# Patient Record
Sex: Female | Born: 1935 | Race: Black or African American | Hispanic: No | Smoking: Never smoker
Health system: Southern US, Community
[De-identification: ages and names within clinical notes are randomized; demographics above are authoritative.]

## PROBLEM LIST (undated history)

## (undated) DIAGNOSIS — F0390 Unspecified dementia without behavioral disturbance: Secondary | ICD-10-CM

## (undated) DIAGNOSIS — I10 Essential (primary) hypertension: Secondary | ICD-10-CM

## (undated) HISTORY — PX: APPENDECTOMY: SHX54

## (undated) HISTORY — DX: Essential (primary) hypertension: I10

## (undated) HISTORY — DX: Unspecified dementia, unspecified severity, without behavioral disturbance, psychotic disturbance, mood disturbance, and anxiety: F03.90

## (undated) HISTORY — PX: ABDOMINAL HYSTERECTOMY: SHX81

## (undated) HISTORY — PX: CHOLECYSTECTOMY: SHX55

---

## 2002-07-27 ENCOUNTER — Encounter: Payer: Self-pay | Admitting: Orthopedic Surgery

## 2002-07-27 ENCOUNTER — Ambulatory Visit (HOSPITAL_COMMUNITY): Admission: RE | Admit: 2002-07-27 | Discharge: 2002-07-27 | Payer: Self-pay | Admitting: Orthopedic Surgery

## 2003-01-12 ENCOUNTER — Emergency Department (HOSPITAL_COMMUNITY): Admission: EM | Admit: 2003-01-12 | Discharge: 2003-01-13 | Payer: Self-pay | Admitting: Emergency Medicine

## 2004-05-29 ENCOUNTER — Ambulatory Visit: Payer: Self-pay | Admitting: Orthopedic Surgery

## 2004-07-03 ENCOUNTER — Ambulatory Visit: Payer: Self-pay | Admitting: Orthopedic Surgery

## 2004-07-14 ENCOUNTER — Ambulatory Visit (HOSPITAL_COMMUNITY): Admission: RE | Admit: 2004-07-14 | Discharge: 2004-07-14 | Payer: Self-pay | Admitting: Orthopedic Surgery

## 2004-07-14 ENCOUNTER — Ambulatory Visit: Payer: Self-pay | Admitting: Orthopedic Surgery

## 2004-07-17 ENCOUNTER — Ambulatory Visit: Payer: Self-pay | Admitting: Orthopedic Surgery

## 2004-08-14 ENCOUNTER — Ambulatory Visit: Payer: Self-pay | Admitting: Orthopedic Surgery

## 2004-12-11 ENCOUNTER — Ambulatory Visit: Payer: Self-pay | Admitting: Orthopedic Surgery

## 2006-07-11 ENCOUNTER — Ambulatory Visit: Payer: Self-pay | Admitting: Orthopedic Surgery

## 2007-09-03 ENCOUNTER — Ambulatory Visit: Payer: Self-pay | Admitting: Orthopedic Surgery

## 2007-09-03 DIAGNOSIS — M1710 Unilateral primary osteoarthritis, unspecified knee: Secondary | ICD-10-CM

## 2007-09-03 DIAGNOSIS — M25569 Pain in unspecified knee: Secondary | ICD-10-CM | POA: Insufficient documentation

## 2008-03-10 ENCOUNTER — Ambulatory Visit: Payer: Self-pay | Admitting: Orthopedic Surgery

## 2008-06-11 ENCOUNTER — Telehealth: Payer: Self-pay | Admitting: Orthopedic Surgery

## 2008-06-15 ENCOUNTER — Encounter: Admission: RE | Admit: 2008-06-15 | Discharge: 2008-06-15 | Payer: Self-pay | Admitting: Internal Medicine

## 2008-06-15 ENCOUNTER — Telehealth: Payer: Self-pay | Admitting: Orthopedic Surgery

## 2009-01-10 ENCOUNTER — Ambulatory Visit: Payer: Self-pay | Admitting: Orthopedic Surgery

## 2009-08-17 ENCOUNTER — Telehealth (INDEPENDENT_AMBULATORY_CARE_PROVIDER_SITE_OTHER): Payer: Self-pay | Admitting: *Deleted

## 2009-08-17 ENCOUNTER — Ambulatory Visit: Payer: Self-pay | Admitting: Orthopedic Surgery

## 2010-02-07 NOTE — Progress Notes (Signed)
Summary: daughter asking for phone in of Rx  Phone Note Call from Patient   Caller: Daughter Summary of Call: Patient's daughter Morgan Tucker "Morgan Tucker" Morgan Tucker called to let Dr Romeo Apple know that Morgan Tucker left her prescription with [facility] [place] and has gone back to [place].  Asking for call in to pharmacy in [place], [place] at ph [phone].   Please call patient  to advise Patient ph is [phone] Initial call taken by: Cammie Sickle,  August 17, 2009 4:31 PM  Follow-up for Phone Call        done Follow-up by: Ether Griffins,  August 17, 2009 4:56 PM

## 2010-02-07 NOTE — Assessment & Plan Note (Signed)
Summary: BILAT KNEE PAIN/LAST XR'S HERE MAR 2010/MEDICARE SEC HORIZ/CAF   Visit Type:  Follow-up  CC:  bilateral knee pain.  History of Present Illness: I saw Morgan Tucker in the office today for a followup visit.  She is a 77 years old woman with the complaint of:  bilateral knee pain.  DX:  Arthritis.  Treatment:  Meds.  MEDS: Nabumetome.  Complaints:  She has been having pain for about 1 month. She is out of her medicine, needs refill.  Today, scheduled for:  Recheck.  Review of systems negative.  RIGHT and LEFT knee exam  This is a small framed female, who looks younger than her stated age. Mood and affect are normal. She has relatively normal gait and station. She is oriented x3. Her pulses are good in both feet. She has no lymphadenopathy. Sensation is normal. She has tenderness over both medial joint lines with no swelling. Her range of motion is 125 her knees are stable. Her strength is normal. Meniscal signs are negative.  Previous x-rays from March of 2010, show bilateral moderate osteoarthritis.  Assessment stable arthritis. Recommend injection for RIGHT knee, which is more symptomatic continue nabumetone. Followup as needed   Allergies: 1)  ! * Tylenol With Codeine   Impression & Recommendations:  Problem # 1:  KNEE, ARTHRITIS, DEGEN./OSTEO (ICD-715.96) Assessment Deteriorated  inject RIGHT knee Verbal consent was obtained. The knee was prepped with alcohol and ethyl chloride. 1 cc of depomedrol 40mg /cc and 4 cc of lidocaine 1% was injected. there were no complications.  Her updated medication list for this problem includes:    Nabumetone 500 Mg Tabs (Nabumetone) .Marland Kitchen... 1 by mouth two times a day  Orders: Est. Patient Level III (16109) Joint Aspirate / Injection, Large (20610) Depo- Medrol 40mg  (J1030)  Patient Instructions: 1)  You have received an injection of cortisone today. You may experience increased pain at the injection site. Apply ice pack to  the area for 20 minutes every 2 hours and take 2 xtra strength tylenol every 8 hours. This increased pain will usually resolve in 24 hours. The injection will take effect in 3-10 days.   2)  take medication  Prescriptions: NABUMETONE 500 MG TABS (NABUMETONE) 1 by mouth two times a day  #60 x 5   Entered and Authorized by:   Fuller Canada MD   Signed by:   Fuller Canada MD on 01/10/2009   Method used:   Print then Give to Patient   RxID:   [id number]

## 2010-02-07 NOTE — Assessment & Plan Note (Signed)
Summary: BI KNEE PAIN REQ INJECTION/SEC HOR/BSF   Visit Type:  Follow-up  CC:  bilateral knee pain.  History of Present Illness: I saw Morgan Tucker in the office today for a followup visit.  She is a 77 years old woman with the complaint of:  bilateral knee pain.  DX:  Arthritis.  Treatment:  Meds.  MEDS: Nabumetome, helped, is out of med need refills.  Today she requests injections bilateral knees.  Last injections 01/10/09.  Last xrays 03/2008 for review.    Allergies: 1)  ! * Tylenol With Codeine  Physical Exam  Additional Exam:  She is ambulating with a cane.  She has bilateral medial joint line tenderness.  Mild flexion contracture.  Flexion of 120.  Strength normal muscle tone normal.  strength is normal.  Sensation normal., She is awake alert and oriented x3.  She looked very healthy.   Impression & Recommendations:  Problem # 1:  KNEE, ARTHRITIS, DEGEN./OSTEO (ICD-715.96) Assessment Deteriorated  Bilateral knee injections RIGHT first then LEFT with the following technique Verbal consent was obtained. The knee was prepped with alcohol and ethyl chloride. 1 cc of depomedrol 40mg /cc and 4 cc of lidocaine 1% was injected. there were no complications.  Orders: Est. Patient Level III (54098) Joint Aspirate / Injection, Large (20610) Depo- Medrol 40mg  (J1030)  Medications Added to Medication List This Visit: 1)  Ultracet 37.5-325 Mg Tabs (Tramadol-acetaminophen) .Marland Kitchen.. 1 q 4 -6 as needed pain   take with food  Patient Instructions: 1)  Please schedule a follow-up appointment in 3 months. Prescriptions: ULTRACET 37.5-325 MG TABS (TRAMADOL-ACETAMINOPHEN) 1 q 4 -6 as needed pain   take with food  #60 x 5   Entered and Authorized by:   Fuller Canada MD   Signed by:   Fuller Canada MD on 08/17/2009   Method used:   Print then Give to Patient   RxID:   [id number]

## 2010-03-10 ENCOUNTER — Emergency Department (HOSPITAL_COMMUNITY): Admission: EM | Admit: 2010-03-10 | Discharge: 2010-03-10 | Discharge status: Against Medical Advice | Payer: Self-pay

## 2010-03-10 ENCOUNTER — Emergency Department (HOSPITAL_COMMUNITY)
Admission: EM | Admit: 2010-03-10 | Discharge: 2010-03-10 | Disposition: A | Discharge status: Home / Self Care | Payer: Medicare Other | Attending: Emergency Medicine | Admitting: Emergency Medicine

## 2010-03-10 ENCOUNTER — Emergency Department (HOSPITAL_COMMUNITY)
Admission: EM | Admit: 2010-03-10 | Discharge: 2010-03-10 | Discharge status: Against Medical Advice | Payer: Self-pay | Attending: Emergency Medicine | Admitting: Emergency Medicine

## 2010-03-10 ENCOUNTER — Emergency Department (HOSPITAL_COMMUNITY): Payer: Self-pay

## 2010-03-10 DIAGNOSIS — G309 Alzheimer's disease, unspecified: Secondary | ICD-10-CM | POA: Insufficient documentation

## 2010-03-10 DIAGNOSIS — R112 Nausea with vomiting, unspecified: Secondary | ICD-10-CM | POA: Insufficient documentation

## 2010-03-10 DIAGNOSIS — I129 Hypertensive chronic kidney disease with stage 1 through stage 4 chronic kidney disease, or unspecified chronic kidney disease: Secondary | ICD-10-CM | POA: Insufficient documentation

## 2010-03-10 DIAGNOSIS — F329 Major depressive disorder, single episode, unspecified: Secondary | ICD-10-CM | POA: Insufficient documentation

## 2010-03-10 DIAGNOSIS — N189 Chronic kidney disease, unspecified: Secondary | ICD-10-CM | POA: Insufficient documentation

## 2010-03-10 DIAGNOSIS — F0280 Dementia in other diseases classified elsewhere without behavioral disturbance: Secondary | ICD-10-CM | POA: Insufficient documentation

## 2010-03-10 DIAGNOSIS — E785 Hyperlipidemia, unspecified: Secondary | ICD-10-CM | POA: Insufficient documentation

## 2010-03-10 DIAGNOSIS — F3289 Other specified depressive episodes: Secondary | ICD-10-CM | POA: Insufficient documentation

## 2010-03-10 DIAGNOSIS — M129 Arthropathy, unspecified: Secondary | ICD-10-CM | POA: Insufficient documentation

## 2010-03-10 LAB — COMPREHENSIVE METABOLIC PANEL
ALT: 10 U/L (ref 0–35)
Alkaline Phosphatase: 48 U/L (ref 39–117)
BUN: 15 mg/dL (ref 6–23)
CO2: 29 mEq/L (ref 19–32)
Chloride: 105 mEq/L (ref 96–112)
Glucose, Bld: 89 mg/dL (ref 70–99)
Potassium: 3.6 mEq/L (ref 3.5–5.1)
Sodium: 142 mEq/L (ref 135–145)
Total Bilirubin: 0.9 mg/dL (ref 0.3–1.2)

## 2010-03-10 LAB — CBC
HCT: 33.9 % — ABNORMAL LOW (ref 36.0–46.0)
Hemoglobin: 11.2 g/dL — ABNORMAL LOW (ref 12.0–15.0)
MCV: 85.6 fL (ref 78.0–100.0)
RBC: 3.96 MIL/uL (ref 3.87–5.11)
RDW: 12.6 % (ref 11.5–15.5)
WBC: 6.5 10*3/uL (ref 4.0–10.5)

## 2010-03-10 LAB — URINALYSIS, ROUTINE W REFLEX MICROSCOPIC
Nitrite: NEGATIVE
Specific Gravity, Urine: 1.015 (ref 1.005–1.030)
pH: 6.5 (ref 5.0–8.0)

## 2010-03-10 LAB — DIFFERENTIAL
Eosinophils Relative: 2 % (ref 0–5)
Lymphocytes Relative: 20 % (ref 12–46)
Lymphs Abs: 1.3 10*3/uL (ref 0.7–4.0)
Neutro Abs: 4.6 10*3/uL (ref 1.7–7.7)

## 2010-05-26 NOTE — H&P (Signed)
NAME:  Morgan Tucker, Morgan Tucker NO.:  [id number]   MEDICAL RECORD NO.:  [id number]          PATIENT TYPE:  AMB   LOCATION:  DAY                           FACILITY:  [facility]   PHYSICIAN:  Vickki Hearing, M.D.DATE OF BIRTH:  06/09/1920   DATE OF ADMISSION:  DATE OF DISCHARGE:  LH                                HISTORY & PHYSICAL   CHIEF COMPLAINT:  Knee pain.   HISTORY:  This is an 77 year old female, status post progressive onset of  pain in her right knee associated with swelling and dull ache, moderate in  severity, duration of approximately 3 weeks on initial presentation,  worsened by ambulation.  Apparently the patient felt a pop in her knee and  then she had associated swelling.  She does also give a history of weight  loss, reflux. Other 8 systems reviewed were normal.   ALLERGIES:  She reports allergies to TYLENOL and ASPIRIN.   PAST HISTORY:  Reflux noted as a medical problem.  She has had an  appendectomy, cholecystectomy, knee surgery, tumor excised.   CURRENT MEDICATIONS:  She takes meclizine 12.5, potassium 10, chlorthalidone  25.   FAMILY HISTORY:  Negative.   SOCIAL HISTORY:  Family physician is Dr. Darius Bump.  She is widowed.  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  She does not smoke or drink.  Caffeine use -- 1/4 cup of coffee per  day.  She has a 7th grade education.   PHYSICAL EXAMINATION:  VITAL SIGNS:  Her exam shows a weight of 123, pulse  78, respiratory rate is 16.  GENERAL APPEARANCE:  Normal development, grooming, hygiene and nutrition.  Body habitus ectomorphic.  PSYCHOLOGICAL EXAM:  She is alert and oriented x3.  NEUROLOGIC:  Sensation is normal.  CARDIOVASCULAR:  Pulse is normal.  Venous stasis none.  No edema.  SKIN:  Normal.  EXTREMITIES:  Examination of the right knee reveals normal range of motion,  strength and stability, tenderness and swelling over the pes bursa and  medial compartment.  Her opposite extremity shows no such abnormality; and  her upper  extremities are normal.   RADIOGRAPHS:  Show osteoarthritis, mild, of the knee.   DIAGNOSES:  1.  Osteoarthritis.  2.  Pes bursitis.  3.  Pain with medial meniscal tear.  She has had 1 injection which did help      for approximately a month and presented back with pain and increased      mechanical symptoms.   PLAN:  Arthroscopy right knee with medial meniscectomy.       SEH/MEDQ  D:  07/13/2004  T:  07/13/2004  Job:  [id number]

## 2010-05-26 NOTE — Op Note (Signed)
NAME:  Morgan Tucker, Morgan NO.:  [id number]   MEDICAL RECORD NO.:  [id number]          PATIENT TYPE:  AMB   LOCATION:  DAY                           FACILITY:  [facility]   PHYSICIAN:  Vickki Hearing, M.D.DATE OF BIRTH:  06/09/1920   DATE OF PROCEDURE:  07/14/2004  DATE OF DISCHARGE:                                 OPERATIVE REPORT   PREOPERATIVE DIAGNOSIS:  Medial meniscal tear, right knee.   POSTOPERATIVE DIAGNOSIS:  Medial meniscal tear, right knee.   PROCEDURE:  Arthroscopy, right knee, partial medial meniscectomy.   SURGEON:  Dr. Romeo Apple, no assistants.   ANESTHETIC:  Spinal.   No specimen. No blood loss. No complications. The patient went to PACU in  good condition.   INDICATIONS:  Pain, right knee.   HISTORY:  This is a 77 year old female had pain in a right knee, medial  compartment, treated with cortisone injection and did not help had  mechanical symptoms and pain and presented for surgery.   The patient was identified as Morgan Tucker. Alers in the preop holding area. Her  right knee was marked for surgery, countersigned by the surgeon. History and  physical was updated and antibiotics were given. She was taken to the  operating room. She had a spinal anesthetic. She was placed on the operating  table. Her right knee was prepped and draped in sterile technique at which  time a time-out was taken and completed as required.   The knee was approached from a lateral portal. Diagnostic arthroscopy was  performed. We found a torn medial meniscus, mild grade 1 to 2 changes medial  femoral condyle and tibial plateau. Lateral compartment notch and  patellofemoral joint relatively normal.   Using a medial portal and a probe, we defined the torn meniscus. We resected  the meniscal tear with a straight duckbill forceps, balanced the meniscus  using a straight shaver and a 50-degree ArthroCare wand. The knee was  irrigated. We suctioned all the debris from the  knee, washed the knee, and  then closed the knee with three Steri-  Strips on each portal. We injected 30 cc of Marcaine. We applied a sterile  dressing and a CryoCuff. We took the patient to recovery room in stable  condition. She can be weightbearing as tolerated. Follow-up on Monday.  CryoCuff for 48 hours and then change dressings in 24 hours       SEH/MEDQ  D:  07/14/2004  T:  07/14/2004  Job:  [id number]

## 2010-05-26 NOTE — Op Note (Signed)
NAME:  Morgan Tucker, EPPING NO.:  [id number]   MEDICAL RECORD NO.:  [id number]                   PATIENT TYPE:  AMB   LOCATION:  DAY                                  [facility]  [facility]   PHYSICIAN:  Marlowe Kays, M.D.               DATE OF BIRTH:  06/09/1920   DATE OF PROCEDURE:  07/27/2002  DATE OF DISCHARGE:                                 OPERATIVE REPORT   PREOPERATIVE DIAGNOSIS:  Torn medial meniscus, left knee.   POSTOPERATIVE DIAGNOSIS:  Torn medial meniscus, left knee.   OPERATION:  Left knee arthroscopy with partial medial meniscectomy and  shaving of the medial femoral condyle.   SURGEON:  Marlowe Kays, M.D.   ASSISTANT:  Nurse.   ANESTHESIA:  General.   PATHOLOGY AND JUSTIFICATION FOR PROCEDURE:  She sustained a twisting injury  to her left knee in May of this year due to continued pain in the inner  aspect of the knee with swelling in the knee. I sent her for an MRI which  demonstrated a tear of the body of the medial meniscus, this was performed  on July 03, 2002. Accordingly she is here today for the above mentioned  surgery.   DESCRIPTION OF PROCEDURE:  Satisfactory general anesthesia, pneumatic  tourniquet, thigh stabilizer, left knee was prepped with duraprep, draped in  a sterile field. Superomedial saline inflow. First through an anterior  portal the medial compartment of the knee joint was evaluated and showed  modest synovectomy. She had modest synovitis which I resected. There was  some grade 1-2 chondromalacia of the medial femoral condyle which I gently  smoothed down. She had an extensive tear at the posterior curve going all  the way back to the basic rim of the meniscus which I resected back with  baskets and shaved down until smooth with a 3.5 shaver. Looking up in the  medial gutter and suprapatellar area, she had minimal wear on her patella.  Reversing portals laterally, there was a modest synovitis which I  resected.  She had minimal wear of the lateral joint surfaces and minimal fraying of  the lateral meniscus which did not require treatment. The knee joint was  then irrigated until clear and all fluid possible removed, the two anterior  portals were closed with 4-0 nylon, 20 mL of 0.5% Marcaine with adrenaline,  4 mg of morphine were then instilled through the inflap rasp which was  removed and this portal closed with 4-0 nylon as well. Betadine adaptic dry  sterile dressing were applied, tourniquet was released, she tolerated the  procedure well and was taken to the recovery room in satisfactory condition.  Marlowe Kays, M.D.    JA/MEDQ  D:  07/27/2002  T:  07/27/2002  Job:  [id number]

## 2011-03-20 ENCOUNTER — Ambulatory Visit: Payer: Self-pay | Admitting: Orthopedic Surgery

## 2011-09-12 ENCOUNTER — Ambulatory Visit (INDEPENDENT_AMBULATORY_CARE_PROVIDER_SITE_OTHER): Payer: Medicare Other | Admitting: Orthopedic Surgery

## 2011-09-12 ENCOUNTER — Other Ambulatory Visit: Payer: Self-pay | Admitting: *Deleted

## 2011-09-12 ENCOUNTER — Telehealth: Payer: Self-pay | Admitting: *Deleted

## 2011-09-12 ENCOUNTER — Encounter: Payer: Self-pay | Admitting: Orthopedic Surgery

## 2011-09-12 VITALS — BP 100/60 | Ht 60.0 in | Wt 154.0 lb

## 2011-09-12 DIAGNOSIS — M25569 Pain in unspecified knee: Secondary | ICD-10-CM

## 2011-09-12 DIAGNOSIS — R269 Unspecified abnormalities of gait and mobility: Secondary | ICD-10-CM

## 2011-09-12 NOTE — Telephone Encounter (Signed)
Opened in error

## 2011-09-12 NOTE — Patient Instructions (Addendum)
PT [facility] (NO PHONE CALL NEEDED)  You have received a steroid shot. 15% of patients experience increased pain at the injection site with in the next 24 hours. This is best treated with ice and tylenol extra strength 2 tabs every 8 hours. If you are still having pain please call the office.

## 2011-09-12 NOTE — Progress Notes (Signed)
Patient ID: FELICIDAD SUGARMAN, female   DOB: Nov 08, 1920, 78 y.o.   MRN: [id number]   Knee  Injection Procedure Note  Pre-operative Diagnosis: left knee oa  Post-operative Diagnosis: same  Indications: pain  Anesthesia: ethyl chloride   Procedure Details   Verbal consent was obtained for the procedure. Time out was completed.The joint was prepped with alcohol, followed by  Ethyl chloride spray and A 20 gauge needle was inserted into the knee via lateral approach; 4ml 1% lidocaine and 1 ml of depomedrol  was then injected into the joint . The needle was removed and the area cleansed and dressed.  Complications:  None; patient tolerated the procedure well.  Knee  Injection Procedure Note  Pre-operative Diagnosis: right knee oa  Post-operative Diagnosis: same  Indications: pain  Anesthesia: ethyl chloride   Procedure Details   Verbal consent was obtained for the procedure. Time out was completed.The joint was prepped with alcohol, followed by  Ethyl chloride spray and A 20 gauge needle was inserted into the knee via lateral approach; 4ml 1% lidocaine and 1 ml of depomedrol  was then injected into the joint . The needle was removed and the area cleansed and dressed.  Complications:  None; patient tolerated the procedure well.

## 2012-01-23 ENCOUNTER — Ambulatory Visit (INDEPENDENT_AMBULATORY_CARE_PROVIDER_SITE_OTHER): Payer: PRIVATE HEALTH INSURANCE

## 2012-01-23 ENCOUNTER — Encounter: Payer: Self-pay | Admitting: Orthopedic Surgery

## 2012-01-23 ENCOUNTER — Ambulatory Visit: Payer: PRIVATE HEALTH INSURANCE

## 2012-01-23 ENCOUNTER — Ambulatory Visit (INDEPENDENT_AMBULATORY_CARE_PROVIDER_SITE_OTHER): Payer: PRIVATE HEALTH INSURANCE | Admitting: Orthopedic Surgery

## 2012-01-23 VITALS — Ht 60.0 in

## 2012-01-23 DIAGNOSIS — M25569 Pain in unspecified knee: Secondary | ICD-10-CM

## 2012-01-23 DIAGNOSIS — M48061 Spinal stenosis, lumbar region without neurogenic claudication: Secondary | ICD-10-CM | POA: Insufficient documentation

## 2012-01-23 MED ORDER — GABAPENTIN 100 MG PO CAPS: 100.0000 mg | ORAL_CAPSULE | Freq: Three times a day (TID) | ORAL | Status: DC

## 2012-01-23 NOTE — Progress Notes (Signed)
Patient ID: Morgan Tucker, female   DOB: 1920/02/15, 79 y.o.   MRN: [id number] Chief Complaint  Patient presents with  . Knee Pain     This is an 79 year old female currently living at a nursing home, who was found on the floor after her legs gave way. She has a history of osteoarthritis of both knees, but felt throbbing, burning, 9/10. Constant pain, unrelieved by Lidoderm patch, and oral medication.  She is currently using a walker to ambulate. She says she doesn't have a lot of pain but her family, says she's constantly complaining.  She has a review of systems consistent with unsteady gait, nervousness depression. Ordering of the eyes and muscle, joint pain.  Past Medical History  Diagnosis Date  . Dementia   . HTN (hypertension)     Physical Exam(12) GENERAL: normal development   CDV: pulses are normal   Skin: normal  Lymph: nodes were not palpable/normal  Psychiatric: awake, alert and oriented  Neuro: normal sensation  Right Knee Exam   Tenderness  The patient is experiencing no tenderness.     Range of Motion  Extension: 10  Flexion: 110   Muscle Strength   The patient has normal right knee strength.  Tests  Drawer:       Anterior - negative    Posterior - negative  Other  Erythema: absent Scars: absent Sensation: normal Pulse: present Swelling: none   Left Knee Exam   Tenderness  The patient is experiencing no tenderness.     Range of Motion  Extension: 10  Flexion: 110   Muscle Strength   The patient has normal left knee strength.  Tests  Drawer:       Anterior - negative     Posterior - negative  Other  Erythema: absent Scars: absent Sensation: normal Pulse: present Swelling: none    The patient had new x-rays of her knees today. Her knees actually show mild to moderate arthrosis.  She most likely has spinal stenosis with weakness of the lower extremities intermittently.  Recommend 100 mg 3 times a day, continue to use  of a walker.  She is not a surgical candidate.

## 2012-01-23 NOTE — Patient Instructions (Addendum)
Start gabapentin 100 mg tid   Spinal Stenosis One cause of back pain is spinal stenosis. Stenosis means abnormal narrowing. The spinal canal contains and protects the spinal nerve roots. In spinal stenosis, the spinal canal narrows and pinches the spinal cord and nerves. This causes low back pain and pain in the legs. Stenosis may pinch the nerves that control muscles and sensation in the legs. This leads to pain and abnormal feelings in the leg muscles and areas supplied by those nerves. CAUSES   Spinal stenosis often happens to people as they get older and arthritic boney growths occur in their spinal canal. There is also a loss of the disk height between the bones of the back, which also adds to this problem. Sometimes the problem is present at birth. SYMPTOMS    Pain that is generally worse with activities, particularly standing and walking.   Numbness, tingling, hot or cold feelings, weakness, or a weariness in the legs.   Clumsiness, frequent falling, and a foot-slapping gait, which may come as a result of nerve pressure and muscle weakness.  DIAGNOSIS    Your caregiver may suspect spinal stenosis if you have unusual leg symptoms, such as those previously mentioned.   Your orthopedic surgeon may request special imaging exams, such a computerized magnetic scan (MRI) or computerized X-ray scan (CT) to find out the cause of the problem.  TREATMENT    Sometimes treatments such as postural changes or nonsteroidal anti-inflammatory drugs will relieve the pain.   Nonsteroidal anti-inflammatory medications may help relieve symptoms. These medicines do this by decreasing swelling and inflammation in the nerves.   When stenosis causes severe nerve root compression, conservative treatment may not be enough to maintain a normal lifestyle. Surgery may be recommended to relieve the pressure on affected nerves. In properly selected patients, the results are very good, and patients are able to continue  a normal lifestyle.  HOME CARE INSTRUCTIONS    Flexing the spine by leaning forward while walking may relieve symptoms. Lying with the knees drawn up to the chest may offer some relief. These positions enlarge the space available to the nerves. They may make it easier for stenosis sufferers to walk longer distances.   Rest, followed by gradually resuming activity, also can help.   Aerobic activity, such as bicycling or swimming, is often recommended.   Losing weight can also relieve some of the load on the spine.   Application of warm or cold compresses to the area of pain can be helpful.  SEEK MEDICAL CARE IF:    The periods of relief between episodes of pain become shorter and shorter.   You experience pain that radiates down your leg, even when you are not standing or walking.  SEEK IMMEDIATE MEDICAL CARE IF:    You have a loss of bowel or bladder control.   You have a sudden loss of feeling in your legs.   You suddenly cannot move your legs.  Document Released: 03/17/2003 Document Revised: 03/19/2011 Document Reviewed: 05/12/2009 [facility] Patient Information 2013 [place], [place].

## 2012-03-03 ENCOUNTER — Other Ambulatory Visit: Payer: Self-pay | Admitting: *Deleted

## 2012-03-03 DIAGNOSIS — M48061 Spinal stenosis, lumbar region without neurogenic claudication: Secondary | ICD-10-CM

## 2012-03-03 MED ORDER — GABAPENTIN 100 MG PO CAPS: 100.0000 mg | ORAL_CAPSULE | Freq: Three times a day (TID) | ORAL | Status: DC

## 2012-03-11 ENCOUNTER — Other Ambulatory Visit: Payer: Self-pay | Admitting: *Deleted

## 2012-03-11 DIAGNOSIS — M48061 Spinal stenosis, lumbar region without neurogenic claudication: Secondary | ICD-10-CM

## 2012-03-11 MED ORDER — GABAPENTIN 100 MG PO CAPS: 100.0000 mg | ORAL_CAPSULE | Freq: Three times a day (TID) | ORAL | Status: DC

## 2012-06-23 ENCOUNTER — Telehealth: Payer: Self-pay | Admitting: Orthopedic Surgery

## 2012-06-23 NOTE — Telephone Encounter (Signed)
Fleet Contras called this afternoon and said that her mother's legs gave out last Thursday and again today,and she ended up on the floor.  She is not complaining about any new pains, but Lurena Joiner wantes to know if she needs to bring her in for an injection.  She also asked if you think she needs a wheelchair. Please advise. Rebecca's # [phone] (can leave a message)

## 2012-06-23 NOTE — Telephone Encounter (Signed)
No answer, could not leave a message, will try tomorrow

## 2012-06-23 NOTE — Telephone Encounter (Signed)
Wheel chair is fine  Its not arthritis Its spinal stenosis and the only way to fix it is with a spinal decompression which she is not a candidate for   Injection will not help

## 2012-06-30 NOTE — Telephone Encounter (Signed)
Spoke to [place] , relayed doctor's message

## 2013-01-04 ENCOUNTER — Emergency Department (HOSPITAL_COMMUNITY): Payer: PRIVATE HEALTH INSURANCE

## 2013-01-04 ENCOUNTER — Emergency Department (HOSPITAL_COMMUNITY)
Admission: EM | Admit: 2013-01-04 | Discharge: 2013-01-04 | Disposition: A | Discharge status: Home / Self Care | Payer: PRIVATE HEALTH INSURANCE | Attending: Emergency Medicine | Admitting: Emergency Medicine

## 2013-01-04 ENCOUNTER — Encounter (HOSPITAL_COMMUNITY): Payer: Self-pay | Admitting: Emergency Medicine

## 2013-01-04 DIAGNOSIS — S79919A Unspecified injury of unspecified hip, initial encounter: Secondary | ICD-10-CM | POA: Insufficient documentation

## 2013-01-04 DIAGNOSIS — Y9210 Unspecified residential institution as the place of occurrence of the external cause: Secondary | ICD-10-CM | POA: Insufficient documentation

## 2013-01-04 DIAGNOSIS — Z79899 Other long term (current) drug therapy: Secondary | ICD-10-CM | POA: Insufficient documentation

## 2013-01-04 DIAGNOSIS — W19XXXA Unspecified fall, initial encounter: Secondary | ICD-10-CM

## 2013-01-04 DIAGNOSIS — S3981XA Other specified injuries of abdomen, initial encounter: Secondary | ICD-10-CM | POA: Insufficient documentation

## 2013-01-04 DIAGNOSIS — F0390 Unspecified dementia without behavioral disturbance: Secondary | ICD-10-CM | POA: Insufficient documentation

## 2013-01-04 DIAGNOSIS — Y939 Activity, unspecified: Secondary | ICD-10-CM | POA: Insufficient documentation

## 2013-01-04 DIAGNOSIS — I10 Essential (primary) hypertension: Secondary | ICD-10-CM | POA: Insufficient documentation

## 2013-01-04 DIAGNOSIS — R296 Repeated falls: Secondary | ICD-10-CM | POA: Insufficient documentation

## 2013-01-04 MED ORDER — FENTANYL CITRATE 0.05 MG/ML IJ SOLN
50.0000 ug | Freq: Once | INTRAMUSCULAR | Status: AC
  Administered 2013-01-04: 50 ug via INTRAMUSCULAR

## 2013-01-04 MED ORDER — FENTANYL CITRATE 0.05 MG/ML IJ SOLN
50.0000 ug | INTRAMUSCULAR | Status: DC | PRN
  Filled 2013-01-04: qty 2

## 2013-01-04 NOTE — ED Notes (Signed)
Per grandaughter at bedside, pt doesn't walk, she is in a wheelchair due to weakness in her knees. She is able to slide herself over from chair to bed or pull up on rail and pivot to the commode if near by.   Assisted patient to stand at bedside. She was able to bear weight on both BLE, with assist x 2 and tolerated. Did not scream or yell d/t pain.

## 2013-01-04 NOTE — ED Notes (Signed)
Bed: WA02 Expected date: 01/04/13 Expected time: 7:37 PM Means of arrival: Ambulance Comments: fall

## 2013-01-04 NOTE — ED Notes (Addendum)
Pleasant, alert and oriented x 1, person.  C/o groin pain but unsure how it happened. She is able to move feet and  Can attempt to bend knees.

## 2013-01-04 NOTE — Discharge Instructions (Signed)

## 2013-01-04 NOTE — ED Notes (Signed)
Dispatch for [facility] called for transportation toSNF

## 2013-01-04 NOTE — ED Provider Notes (Addendum)
CSN: [id number]     Arrival date & time 01/04/13  1945 History   First MD Initiated Contact with Patient 01/04/13 2006     Chief Complaint  Patient presents with  . Fall   (Consider location/radiation/quality/duration/timing/severity/associated sxs/prior Treatment) HPI Comments: Level 5 caveat due to dementia.  Pt lives at a SNF, was found on the floor, she believes she lives alone near her family.  She had left side groin pain per EMS upon their assessment, but no obvious deformity.  She denies HA, LOC, CP, SOB, back pain, neck pain.  No N/V.    Patient is a 79 y.o. female presenting with fall. The history is provided by the patient and the EMS personnel. The history is limited by the condition of the patient.  Fall This is a new problem. Pertinent negatives include no chest pain, no abdominal pain, no headaches and no shortness of breath.    Past Medical History  Diagnosis Date  . Dementia   . HTN (hypertension)    Past Surgical History  Procedure Laterality Date  . Cholecystectomy    . Appendectomy    . Abdominal hysterectomy     History reviewed. No pertinent family history. History  Substance Use Topics  . Smoking status: Never Smoker   . Smokeless tobacco: Not on file  . Alcohol Use: No   OB History   Grav Para Term Preterm Abortions TAB SAB Ect Mult Living                 Review of Systems  Unable to perform ROS: Dementia  Respiratory: Negative for shortness of breath.   Cardiovascular: Negative for chest pain.  Gastrointestinal: Negative for abdominal pain.  Neurological: Negative for headaches.    Allergies  Acetaminophen-codeine and Asa  Home Medications   Current Outpatient Rx  Name  Route  Sig  Dispense  Refill  . DULoxetine (CYMBALTA) 20 MG capsule   Oral   Take 20 mg by mouth daily.         Marland Kitchen gabapentin (NEURONTIN) 100 MG capsule   Oral   Take 1 capsule (100 mg total) by mouth 3 (three) times daily.   90 capsule   2   . lidocaine  (LIDODERM) 5 %   Transdermal   Place 1 patch onto the skin daily. Remove & Discard patch within 12 hours or as directed by MD         . memantine (NAMENDA) 5 MG tablet   Oral   Take 5 mg by mouth 2 (two) times daily.         . mirtazapine (REMERON) 15 MG tablet   Oral   Take 45 mg by mouth at bedtime.         . rivastigmine (EXELON) 4.6 mg/24hr   Transdermal   Place 4.6 mg onto the skin daily.         . benzonatate (TESSALON) 100 MG capsule   Oral   Take 100 mg by mouth 3 (three) times daily as needed for cough.          Marland Kitchen LORazepam (ATIVAN) 0.5 MG tablet   Oral   Take 0.5 mg by mouth every 4 (four) hours as needed for anxiety. One tablet by mouth 1 hour prior to trip         . ondansetron (ZOFRAN-ODT) 8 MG disintegrating tablet   Oral   Take 8 mg by mouth every 4 (four) hours as needed for nausea.  BP 174/71  Pulse 67  Temp(Src) 98.9 F (37.2 C) (Oral)  Resp 20  SpO2 98% Physical Exam  Nursing note and vitals reviewed. Constitutional: She appears well-developed and well-nourished.  HENT:  Head: Normocephalic and atraumatic.  Eyes: EOM are normal.  Neck: Normal range of motion and full passive range of motion without pain. Neck supple. No spinous process tenderness and no muscular tenderness present.  Cardiovascular: Normal rate, regular rhythm and intact distal pulses.   Pulmonary/Chest: Effort normal. No respiratory distress. She has no wheezes. She has no rales.  Abdominal: Soft. She exhibits no distension. There is no tenderness. There is no rebound.  Musculoskeletal: She exhibits tenderness.       Right shoulder: She exhibits normal range of motion and no tenderness.       Left shoulder: She exhibits normal range of motion and no tenderness.       Left hip: She exhibits decreased range of motion and tenderness. She exhibits no swelling, no deformity and no laceration.       Right knee: No tenderness found.       Left knee: No tenderness  found.       Cervical back: She exhibits normal range of motion, no tenderness and no bony tenderness.       Lumbar back: She exhibits no tenderness and no bony tenderness.  Neurological: She is alert. She exhibits normal muscle tone.  Skin: Skin is warm and dry.  Psychiatric: She has a normal mood and affect.    ED Course  Procedures (including critical care time) Labs Review Labs Reviewed - No data to display Imaging Review Dg Hip Complete Left  01/04/2013   CLINICAL DATA:  Fall.  Left hip pain.  EXAM: LEFT HIP - COMPLETE 2+ VIEW  COMPARISON:  None.  FINDINGS: No evidence of acute fracture or dislocation. Mild to moderate gas that mild joint space narrowing. Generalized osseous demineralization.  Included AP pelvis demonstrates symmetric mild joint space narrowing involving the contralateral right hip. Sacroiliac joints and symphysis pubis intact. Mild degenerative changes involving the lower lumbar spine.  IMPRESSION: No acute osseous abnormality. Mild osteoarthritis. Generalized osseous demineralization.   Electronically Signed   By: Hulan Saas M.D.   On: 01/04/2013 20:33    EKG Interpretation   None      RA sat is 98% and I interpret to be normal  10:32 PM Upon re-exam and with standing and bearing weight, pt was able to tolerate, no sig pain in hip.  Will d/c to facility.    MDM   1. Fall at nursing home, initial encounter      Pt with apparently unwitnessed fall, however pt denies CP, SOB, nausea, doubt ACS or arrythmia, no stroke features. Pt is tender at left hip, plain films, and then try to ambulate.  If she is unable, will get a follow up CP of pelvis as well.  IV fentanyl given for pain.    Gavin Pound. Oletta Lamas, MD 01/04/13 2232  Gavin Pound. Oletta Lamas, MD 01/04/13 2233

## 2013-01-04 NOTE — ED Notes (Signed)
Per SNF pt had un witnessed fall and was  Found lying on her back. Pt states she has pain in her groin upon arrival of EMS. Pt is here for evaluation. States she feels better.  No leg shortening or abnormality assessed by EMS BP 140/80 P: 84 reg RR:16 reg.

## 2013-12-16 ENCOUNTER — Encounter (HOSPITAL_COMMUNITY): Payer: Self-pay

## 2013-12-16 ENCOUNTER — Emergency Department (HOSPITAL_COMMUNITY): Payer: PRIVATE HEALTH INSURANCE

## 2013-12-16 ENCOUNTER — Emergency Department (HOSPITAL_COMMUNITY)
Admission: EM | Admit: 2013-12-16 | Discharge: 2013-12-16 | Disposition: A | Discharge status: Other Acute Inpatient Hospital | Payer: PRIVATE HEALTH INSURANCE | Attending: Emergency Medicine | Admitting: Emergency Medicine

## 2013-12-16 DIAGNOSIS — Y9389 Activity, other specified: Secondary | ICD-10-CM | POA: Diagnosis not present

## 2013-12-16 DIAGNOSIS — Y929 Unspecified place or not applicable: Secondary | ICD-10-CM | POA: Diagnosis not present

## 2013-12-16 DIAGNOSIS — S0081XA Abrasion of other part of head, initial encounter: Secondary | ICD-10-CM | POA: Diagnosis not present

## 2013-12-16 DIAGNOSIS — N390 Urinary tract infection, site not specified: Secondary | ICD-10-CM | POA: Insufficient documentation

## 2013-12-16 DIAGNOSIS — Z79899 Other long term (current) drug therapy: Secondary | ICD-10-CM | POA: Diagnosis not present

## 2013-12-16 DIAGNOSIS — I10 Essential (primary) hypertension: Secondary | ICD-10-CM | POA: Diagnosis not present

## 2013-12-16 DIAGNOSIS — Y998 Other external cause status: Secondary | ICD-10-CM | POA: Insufficient documentation

## 2013-12-16 DIAGNOSIS — F0390 Unspecified dementia without behavioral disturbance: Secondary | ICD-10-CM | POA: Diagnosis not present

## 2013-12-16 DIAGNOSIS — W19XXXA Unspecified fall, initial encounter: Secondary | ICD-10-CM

## 2013-12-16 DIAGNOSIS — S0990XA Unspecified injury of head, initial encounter: Secondary | ICD-10-CM | POA: Diagnosis present

## 2013-12-16 LAB — URINALYSIS, ROUTINE W REFLEX MICROSCOPIC
Bilirubin Urine: NEGATIVE
GLUCOSE, UA: NEGATIVE mg/dL
Hgb urine dipstick: NEGATIVE
Ketones, ur: NEGATIVE mg/dL
Nitrite: NEGATIVE
PROTEIN: NEGATIVE mg/dL
SPECIFIC GRAVITY, URINE: 1.023 (ref 1.005–1.030)
Urobilinogen, UA: 1 mg/dL (ref 0.0–1.0)
pH: 6 (ref 5.0–8.0)

## 2013-12-16 LAB — URINE MICROSCOPIC-ADD ON

## 2013-12-16 MED ORDER — CEPHALEXIN 500 MG PO CAPS
1000.0000 mg | ORAL_CAPSULE | Freq: Once | ORAL | Status: AC
  Administered 2013-12-16: 1000 mg via ORAL
  Filled 2013-12-16: qty 2

## 2013-12-16 MED ORDER — CEPHALEXIN 500 MG PO CAPS: 1000.0000 mg | ORAL_CAPSULE | Freq: Two times a day (BID) | ORAL | Status: AC

## 2013-12-16 NOTE — ED Notes (Signed)
Note: with three staff members we clear her from the backboard--no pain elicited from spine palpation.

## 2013-12-16 NOTE — Discharge Instructions (Signed)
Fall Prevention and Home Safety Falls cause injuries and can affect all age groups. It is possible to use preventive measures to significantly decrease the likelihood of falls. There are many simple measures which can make your home safer and prevent falls. OUTDOORS  Repair cracks and edges of walkways and driveways.  Remove high doorway thresholds.  Trim shrubbery on the main path into your home.  Have good outside lighting.  Clear walkways of tools, rocks, debris, and clutter.  Check that handrails are not broken and are securely fastened. Both sides of steps should have handrails.  Have leaves, snow, and ice cleared regularly.  Use sand or salt on walkways during winter months.  In the garage, clean up grease or oil spills. BATHROOM  Install night lights.  Install grab bars by the toilet and in the tub and shower.  Use non-skid mats or decals in the tub or shower.  Place a plastic non-slip stool in the shower to sit on, if needed.  Keep floors dry and clean up all water on the floor immediately.  Remove soap buildup in the tub or shower on a regular basis.  Secure bath mats with non-slip, double-sided rug tape.  Remove throw rugs and tripping hazards from the floors. BEDROOMS  Install night lights.  Make sure a bedside light is easy to reach.  Do not use oversized bedding.  Keep a telephone by your bedside.  Have a firm chair with side arms to use for getting dressed.  Remove throw rugs and tripping hazards from the floor. KITCHEN  Keep handles on pots and pans turned toward the center of the stove. Use back burners when possible.  Clean up spills quickly and allow time for drying.  Avoid walking on wet floors.  Avoid hot utensils and knives.  Position shelves so they are not too high or low.  Place commonly used objects within easy reach.  If necessary, use a sturdy step stool with a grab bar when reaching.  Keep electrical cables out of the  way.  Do not use floor polish or wax that makes floors slippery. If you must use wax, use non-skid floor wax.  Remove throw rugs and tripping hazards from the floor. STAIRWAYS  Never leave objects on stairs.  Place handrails on both sides of stairways and use them. Fix any loose handrails. Make sure handrails on both sides of the stairways are as long as the stairs.  Check carpeting to make sure it is firmly attached along stairs. Make repairs to worn or loose carpet promptly.  Avoid placing throw rugs at the top or bottom of stairways, or properly secure the rug with carpet tape to prevent slippage. Get rid of throw rugs, if possible.  Have an electrician put in a light switch at the top and bottom of the stairs. OTHER FALL PREVENTION TIPS  Wear low-heel or rubber-soled shoes that are supportive and fit well. Wear closed toe shoes.  When using a stepladder, make sure it is fully opened and both spreaders are firmly locked. Do not climb a closed stepladder.  Add color or contrast paint or tape to grab bars and handrails in your home. Place contrasting color strips on first and last steps.  Learn and use mobility aids as needed. Install an electrical emergency response system.  Turn on lights to avoid dark areas. Replace light bulbs that burn out immediately. Get light switches that glow.  Arrange furniture to create clear pathways. Keep furniture in the same place.  Firmly attach carpet with non-skid or double-sided tape.  Eliminate uneven floor surfaces.  Select a carpet pattern that does not visually hide the edge of steps.  Be aware of all pets. OTHER HOME SAFETY TIPS  Set the water temperature for 120 F (48.8 C).  Keep emergency numbers on or near the telephone.  Keep smoke detectors on every level of the home and near sleeping areas. Document Released: 12/15/2001 Document Revised: 06/26/2011 Document Reviewed: 03/16/2011 [facility] Patient Information 2015  [place]. This information is not intended to replace advice given to you by your health care provider. Make sure you discuss any questions you have with your health care provider.   Asymptomatic Bacteriuria Asymptomatic bacteriuria is the presence of a large number of bacteria in your urine without the usual symptoms of burning or frequent urination. The following conditions increase the risk of asymptomatic bacteriuria:  Diabetes mellitus.  Advanced age.  Pregnancy in the first trimester.  Kidney stones.  Kidney transplants.  Leaky kidney tube valve in young children (reflux). Treatment for this condition is not needed in most people and can lead to other problems such as too much yeast and growth of resistant bacteria. However, some people, such as pregnant women, do need treatment to prevent kidney infection. Asymptomatic bacteriuria in pregnancy is also associated with fetal growth restriction, premature labor, and newborn death. HOME CARE INSTRUCTIONS Monitor your condition for any changes. The following actions may help to relieve any discomfort you are feeling:  Drink enough water and fluids to keep your urine clear or pale yellow. Go to the bathroom more often to keep your bladder empty.  Keep the area around your vagina and rectum clean. Wipe yourself from front to back after urinating. SEEK IMMEDIATE MEDICAL CARE IF:  You develop signs of an infection such as:  Burning with urination.  Frequency of voiding.  Back pain.  Fever.  You have blood in the urine.  You develop a fever. MAKE SURE YOU:  Understand these instructions.  Will watch your condition.  Will get help right away if you are not doing well or get worse. Document Released: 12/25/2004 Document Revised: 05/11/2013 Document Reviewed: 06/16/2012 [facility] Patient Information 2015 [place], [place]. This information is not intended to replace advice given to you by your health care provider. Make  sure you discuss any questions you have with your health care provider.

## 2013-12-16 NOTE — Progress Notes (Signed)
CSW met with patient at bedside. Patient was not communicative. Family was present at bedside. Family informed CSW that the patient lives at [facility] on the memory care unit. Family says that patient fell from wheel chair.  Daughter/ Mitchel Honour [phone] or [phone]  Willette Brace [phone] ED CSW 12/16/2013 4:52 PM

## 2013-12-16 NOTE — ED Provider Notes (Signed)
CSN: [id number]     Arrival date & time 12/16/13  1426 History   First MD Initiated Contact with Patient 12/16/13 1501     Chief Complaint  Patient presents with  . Fall     (Consider location/radiation/quality/duration/timing/severity/associated sxs/prior Treatment) HPI   80 year old female with hx of dementia and HTN who arrived via EMS from [facility] facility for evaluation of a fall.  Staff report pt had a witnessed fall when she fell forward from a wheel chair, landing face-first on a carpeted surface, which happened shortly PTA.  Daughter is at bedside. Per daughter, pt has been at her baseline, last interaction was last night.  She is usually sitting in wheel chair.  Daughter suspect pt may have fallen as sleep, lost balance and fell forward.  Per daughter, pt is at her baseline at this time.  Pt currently does not complaint of pain.  She is not on any anticoagulants.  She has been eating/drinking as usual.  Pt was placed on an Aspen cervical collar once arrived.    Past Medical History  Diagnosis Date  . Dementia   . HTN (hypertension)    Past Surgical History  Procedure Laterality Date  . Cholecystectomy    . Appendectomy    . Abdominal hysterectomy     No family history on file. History  Substance Use Topics  . Smoking status: Never Smoker   . Smokeless tobacco: Not on file  . Alcohol Use: No   OB History    No data available     Review of Systems  Unable to perform ROS: Dementia      Allergies  Acetaminophen-codeine; Asa; and Beef-derived products  Home Medications   Prior to Admission medications   Medication Sig Start Date End Date Taking? Authorizing Provider  acetaminophen (Q-PAP) 500 MG tablet Take 500 mg by mouth 2 (two) times daily.   Yes Historical Provider, MD  furosemide (LASIX) 20 MG tablet Take 20 mg by mouth daily with breakfast.   Yes Historical Provider, MD  lidocaine (XYLOCAINE) 5 % ointment Apply 1 application topically 3 (three) times  daily.   Yes Historical Provider, MD  LORazepam (ATIVAN) 0.5 MG tablet Take 0.5 mg by mouth every 4 (four) hours as needed for anxiety. One tablet by mouth 1 hour prior to trip   Yes Historical Provider, MD  mirtazapine (REMERON) 15 MG tablet Take 7.5 mg by mouth at bedtime.    Yes Historical Provider, MD  nebivolol (BYSTOLIC) 2.5 MG tablet Take 2.5 mg by mouth daily after breakfast.   Yes Historical Provider, MD  ondansetron (ZOFRAN-ODT) 8 MG disintegrating tablet Take 8 mg by mouth every 4 (four) hours as needed for nausea or vomiting.    Yes Historical Provider, MD  potassium chloride (K-DUR) 10 MEQ tablet Take 10 mEq by mouth daily with breakfast.   Yes Historical Provider, MD  senna-docusate (SENOKOT-S) 8.6-50 MG per tablet Take 1 tablet by mouth daily as needed for mild constipation.   Yes Historical Provider, MD  traMADol (ULTRAM) 50 MG tablet Take 25 mg by mouth 2 (two) times daily.   Yes Historical Provider, MD   BP 110/59 mmHg  Pulse 71  Temp(Src) 98.2 F (36.8 C) (Oral)  Resp 20  SpO2 100% Physical Exam  Constitutional: She appears well-developed and well-nourished. No distress.  HENT:  Head: Normocephalic.  Small abrasion noted to L forehead, no crepitus, no laceration.    No hemotypanum, no septal hematoma, no malocclusion, no  midface tenderness.    Eyes: Conjunctivae are normal.  Neck: Neck supple.  Aspen collar in place.  No tenderness to cervical midline spine, no crepitus or step off.   Cardiovascular: Normal rate, regular rhythm and intact distal pulses.   Pulmonary/Chest: Effort normal and breath sounds normal. No respiratory distress. She exhibits no tenderness.  Abdominal: Soft. There is no tenderness.  Musculoskeletal:  Moving all 4 extremities with normal 5/5 strength  Neurological: She is alert. She has normal strength. No cranial nerve deficit. GCS eye subscore is 4. GCS verbal subscore is 5. GCS motor subscore is 6.  Alert, in no acute distress.  Pt is not  oriented.    Skin: No rash noted.  Psychiatric: She has a normal mood and affect.  Nursing note and vitals reviewed.   ED Course  Procedures (including critical care time)  3:19 PM Pt with witnessed fall from wheel chair.  Likely mechanical from falling asleep while on wheel chair.  Will obtain head/cspine CT, check ECG and UA.  Pt has small abrasion to forehead, will cleansed wound and apply bacitracin.    6:19 PM Head and cervical spine CTs shows no acute fracture or dislocation. Urine did show evidence of a possible urinary tract infection. Given the fact that patient is demented plan to treat with Keflex, urine culture sent. At this time patient is stable for discharge. Return precautions discussed.  Labs Review Labs Reviewed  URINALYSIS, ROUTINE W REFLEX MICROSCOPIC - Abnormal; Notable for the following:    APPearance CLOUDY (*)    Leukocytes, UA MODERATE (*)    All other components within normal limits  URINE MICROSCOPIC-ADD ON - Abnormal; Notable for the following:    Squamous Epithelial / LPF FEW (*)    Bacteria, UA MANY (*)    All other components within normal limits  URINE CULTURE    Imaging Review Ct Head Wo Contrast  12/16/2013   CLINICAL DATA:  Fall from wheelchair, landing face first.  EXAM: CT HEAD WITHOUT CONTRAST  CT CERVICAL SPINE WITHOUT CONTRAST  TECHNIQUE: Multidetector CT imaging of the head and cervical spine was performed following the standard protocol without intravenous contrast. Multiplanar CT image reconstructions of the cervical spine were also generated.  COMPARISON:  06/15/2008  FINDINGS: CT HEAD FINDINGS  There is atrophy and chronic small vessel disease changes. Associated ventriculomegaly. Findings are stable since prior study. No acute intracranial abnormality. Specifically, no hemorrhage, hydrocephalus, mass lesion, acute infarction, or significant intracranial injury. No acute calvarial abnormality. Visualized paranasal sinuses and mastoids clear.  Orbital soft tissues unremarkable.  CT CERVICAL SPINE FINDINGS  Normal alignment. No fracture or subluxation. Prevertebral soft tissues are normal. No epidural or paraspinal hematoma.  IMPRESSION: Atrophy, chronic microvascular changes, stable. No acute intracranial abnormality.  No acute bony abnormality in the cervical spine.   Electronically Signed   By: Charlett NoseKevin  Dover M.D.   On: 12/16/2013 16:50   Ct Cervical Spine Wo Contrast  12/16/2013   CLINICAL DATA:  Fall from wheelchair, landing face first.  EXAM: CT HEAD WITHOUT CONTRAST  CT CERVICAL SPINE WITHOUT CONTRAST  TECHNIQUE: Multidetector CT imaging of the head and cervical spine was performed following the standard protocol without intravenous contrast. Multiplanar CT image reconstructions of the cervical spine were also generated.  COMPARISON:  06/15/2008  FINDINGS: CT HEAD FINDINGS  There is atrophy and chronic small vessel disease changes. Associated ventriculomegaly. Findings are stable since prior study. No acute intracranial abnormality. Specifically, no hemorrhage, hydrocephalus, mass lesion, acute  infarction, or significant intracranial injury. No acute calvarial abnormality. Visualized paranasal sinuses and mastoids clear. Orbital soft tissues unremarkable.  CT CERVICAL SPINE FINDINGS  Normal alignment. No fracture or subluxation. Prevertebral soft tissues are normal. No epidural or paraspinal hematoma.  IMPRESSION: Atrophy, chronic microvascular changes, stable. No acute intracranial abnormality.  No acute bony abnormality in the cervical spine.   Electronically Signed   By: Charlett Nose M.D.   On: 12/16/2013 16:50     EKG Interpretation   Date/Time:  Wednesday December 16 2013 15:39:19 EST Ventricular Rate:  71 PR Interval:  208 QRS Duration: 75 QT Interval:  396 QTC Calculation: 430 R Axis:   14 Text Interpretation:  Sinus rhythm Low voltage, precordial leads Baseline  wander in lead(s) I II aVR no change. no STEMI Confirmed by  Donnald Garre, MD,  Lebron Conners [phone]) on 12/16/2013 3:43:24 PM      MDM   Final diagnoses:  Fall  UTI (lower urinary tract infection)  Forehead abrasion, initial encounter    BP 136/68 mmHg  Pulse 69  Temp(Src) 98.6 F (37 C) (Oral)  Resp 15  SpO2 99%  I have reviewed nursing notes and vital signs. I personally reviewed the imaging tests through PACS system  I reviewed available ER/hospitalization records thought the EMR     Fayrene Helper, PA-C 12/16/13 1821  Arby Barrette, MD 12/17/13 1745

## 2013-12-16 NOTE — ED Notes (Signed)
[facility] forward from a wheelchair; landing face-first on a carpeted surface.  She is awake and confused at her reported baseline; and she is in no distress.

## 2013-12-16 NOTE — ED Notes (Signed)
Bed: [id number] Expected date:  Expected time:  Means of arrival:  Comments: Ems- 80 yo fall

## 2013-12-17 LAB — URINE CULTURE
Colony Count: NO GROWTH
Culture: NO GROWTH

## 2015-08-10 IMAGING — CT CT CERVICAL SPINE W/O CM
3 of 4 series · 12 of 27 positions shown, 15 images · non-contrast
Comparison: 06/15/2008

CLINICAL DATA: Fall from wheelchair, landing face first.

EXAM:
CT HEAD WITHOUT CONTRAST
CT CERVICAL SPINE WITHOUT CONTRAST
TECHNIQUE: Multidetector CT imaging of the head and cervical spine was
performed following the standard protocol without intravenous
contrast. Multiplanar CT image reconstructions of the cervical spine
were also generated.

[Series 3: bone windows · axial · 0.48mm/px · z∈[-148,-100]mm · 2 of 48 slices shown]
[im 16/48  bone]
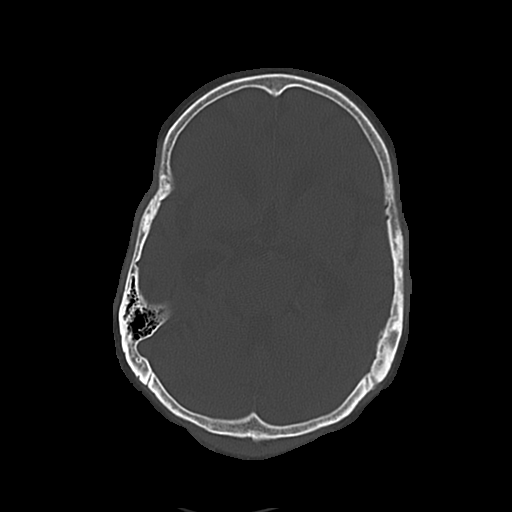
[im 32/48  bone]
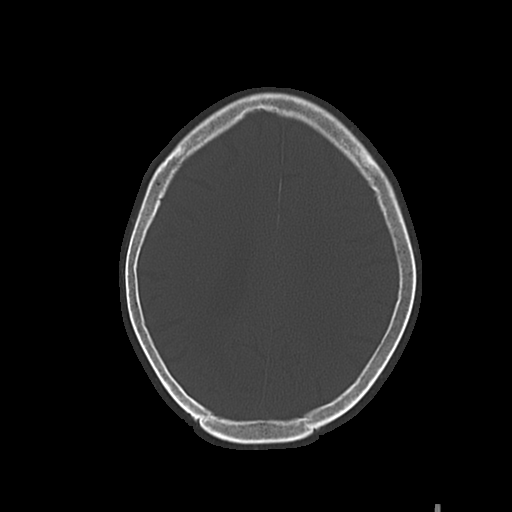

[Series 4: c-spine st · axial · 0.42mm/px · z∈[-270,-170]mm · 5 of 76 slices shown, 7 images]
[im 13/76  soft-tissue]
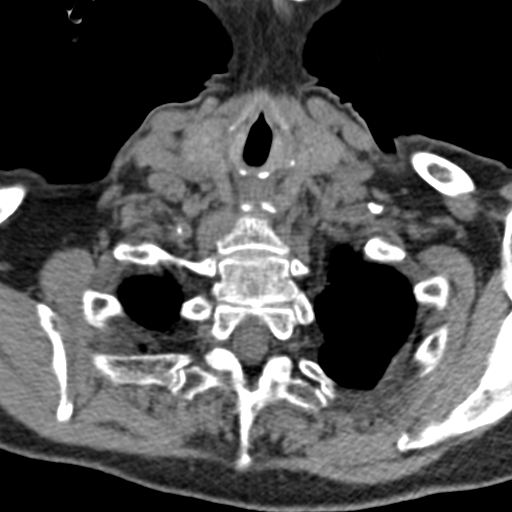
[im 13/76  bone]
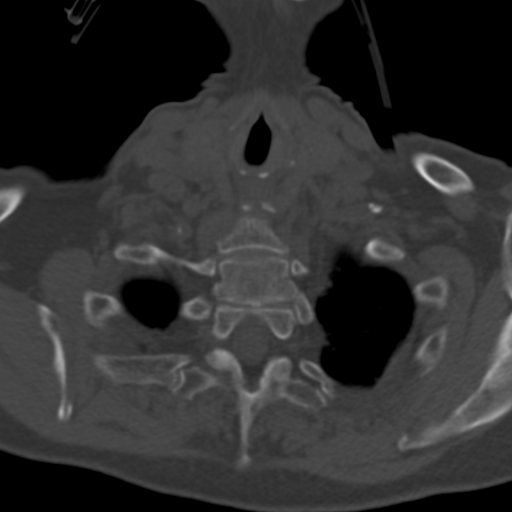
[im 26/76  bone]
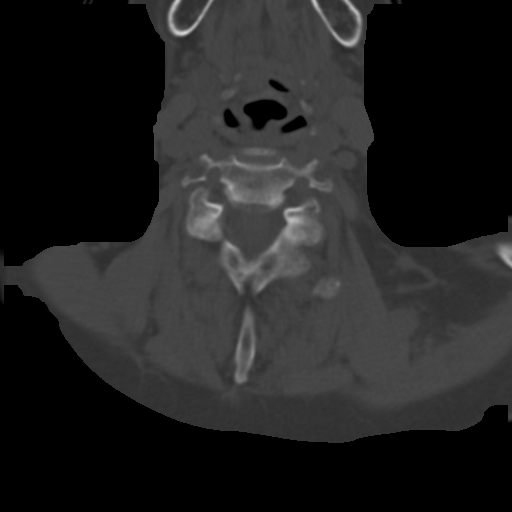
[im 38/76  bone]
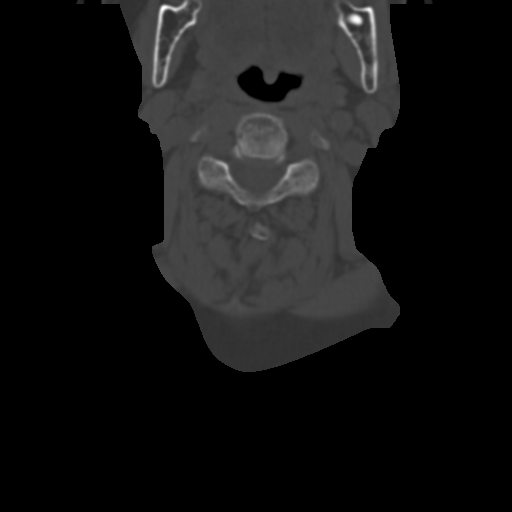
[im 51/76  bone]
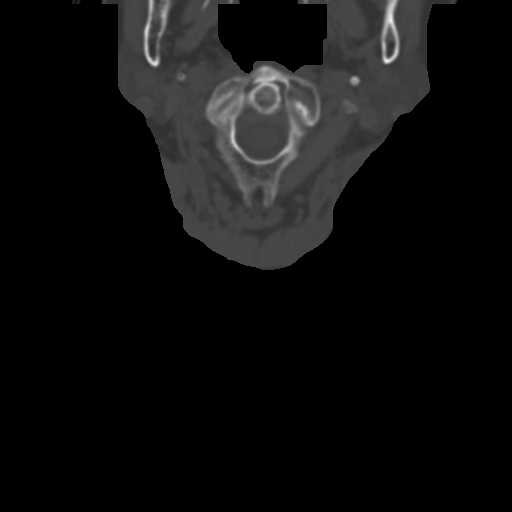
[im 63/76  soft-tissue]
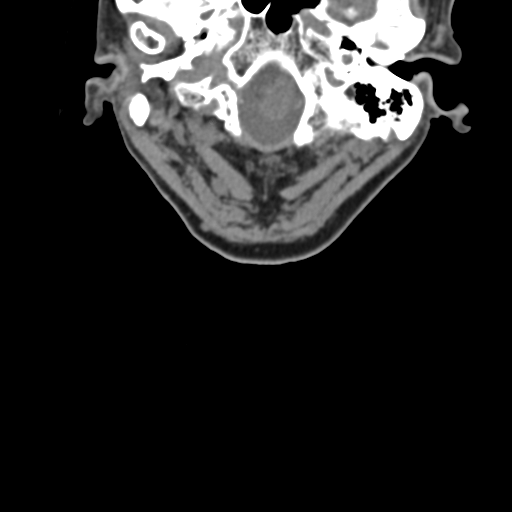
[im 63/76  bone]
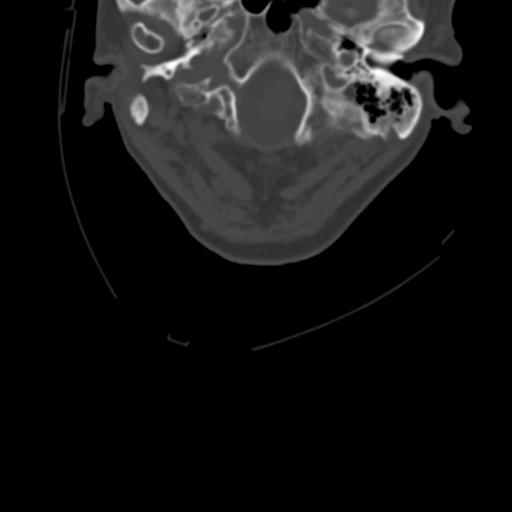

[Series 602: <mpr thick range> · sagittal · 0.42mm/px · 5 of 31 slices shown, 6 images]
[im 11/31  bone]
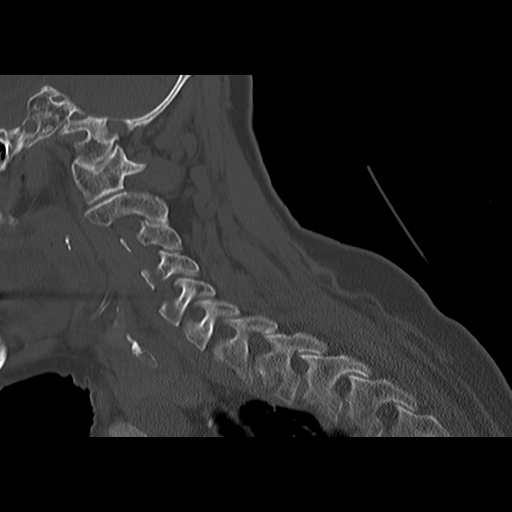
[im 13/31  bone]
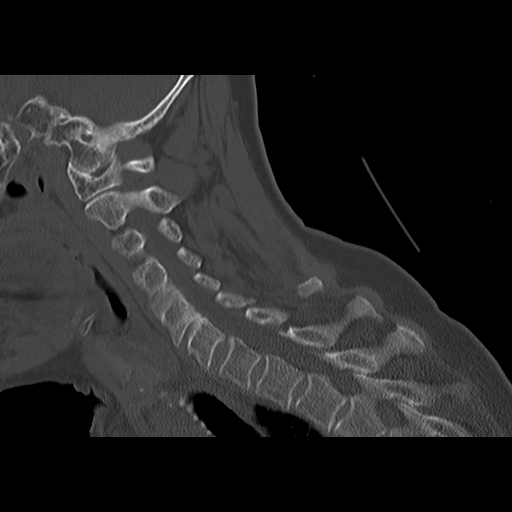
[im 16/31  soft-tissue]
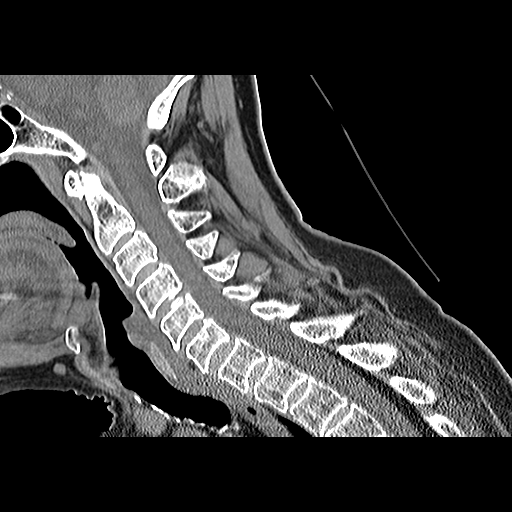
[im 16/31  bone]
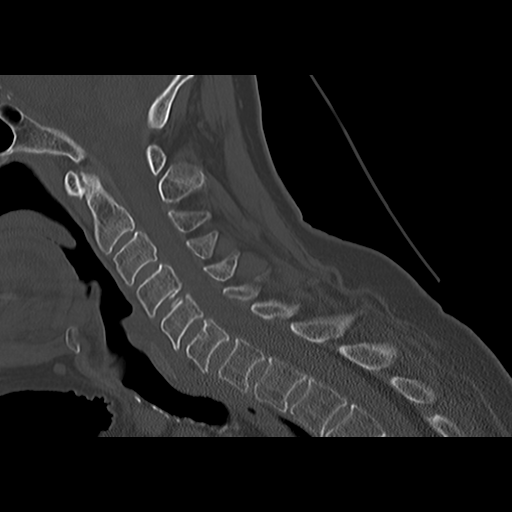
[im 18/31  bone]
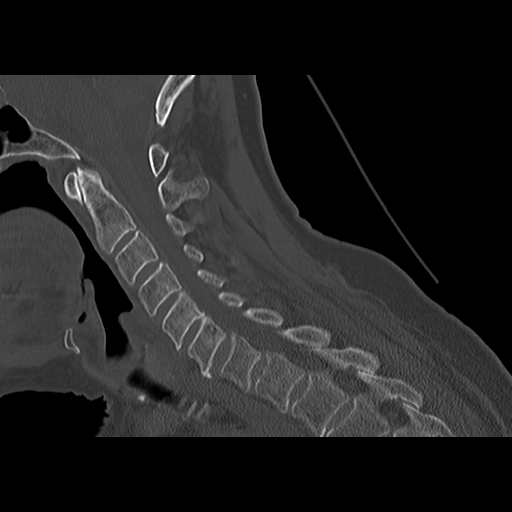
[im 21/31  bone]
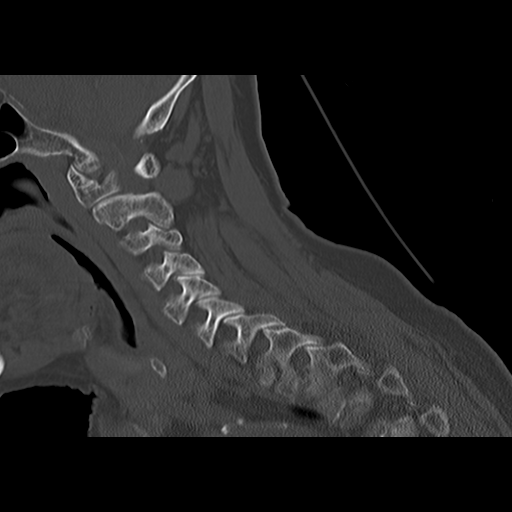

[12 of 27 positions shown; findings below may reference images not displayed]

FINDINGS: CT HEAD FINDINGS

There is atrophy and chronic small vessel disease changes.
Associated ventriculomegaly. Findings are stable since prior study.
No acute intracranial abnormality. Specifically, no hemorrhage,
hydrocephalus, mass lesion, acute infarction, or significant
intracranial injury. No acute calvarial abnormality. Visualized
paranasal sinuses and mastoids clear. Orbital soft tissues
unremarkable.

CT CERVICAL SPINE FINDINGS

Normal alignment. No fracture or subluxation. Prevertebral soft
tissues are normal. No epidural or paraspinal hematoma.
IMPRESSION: Atrophy, chronic microvascular changes, stable. No acute
intracranial abnormality.

No acute bony abnormality in the cervical spine.

## 2015-09-09 DEATH — deceased
